# Patient Record
Sex: Male | Born: 1980 | Race: Black or African American | Hispanic: No | Marital: Married | Smoking: Former smoker
Health system: Southern US, Community
[De-identification: ages and names within clinical notes are randomized; demographics above are authoritative.]

## PROBLEM LIST (undated history)

## (undated) HISTORY — PX: OTHER SURGICAL HISTORY: SHX169

---

## 2015-09-01 ENCOUNTER — Ambulatory Visit (INDEPENDENT_AMBULATORY_CARE_PROVIDER_SITE_OTHER): Payer: 59 | Admitting: Unknown Physician Specialty

## 2015-09-01 ENCOUNTER — Encounter: Payer: Self-pay | Admitting: Unknown Physician Specialty

## 2015-09-01 VITALS — BP 126/72 | HR 56 | Temp 99.2°F | Ht 66.6 in | Wt 155.8 lb

## 2015-09-01 DIAGNOSIS — Z0000 Encounter for general adult medical examination without abnormal findings: Secondary | ICD-10-CM | POA: Diagnosis not present

## 2015-09-01 NOTE — Progress Notes (Signed)
   BP 126/72 mmHg  Pulse 56  Temp(Src) 99.2 F (37.3 C)  Ht 5' 6.6" (1.692 m)  Wt 155 lb 12.8 oz (70.67 kg)  BMI 24.69 kg/m2  SpO2 99%   Subjective:    Patient ID: Navy Belay, male    DOB: [birth year], 34 y.o.   MRN: [id number]  HPI: Janmichael Giraud is a 34 y.o. male  Chief Complaint  Patient presents with  . Establish Care    Relevant past medical, surgical, family and social history reviewed and updated as indicated. Interim medical history since our last visit reviewed. Allergies and medications reviewed and updated.  Review of Systems  Constitutional: Negative.   HENT: Negative.   Eyes: Negative.   Respiratory: Negative.   Cardiovascular: Negative.   Gastrointestinal: Negative.   Endocrine: Negative.   Genitourinary: Negative.   Skin: Negative.   Allergic/Immunologic: Negative.   Neurological: Negative.   Hematological: Negative.   Psychiatric/Behavioral: Negative.     Per HPI unless specifically indicated above     Objective:    BP 126/72 mmHg  Pulse 56  Temp(Src) 99.2 F (37.3 C)  Ht 5' 6.6" (1.692 m)  Wt 155 lb 12.8 oz (70.67 kg)  BMI 24.69 kg/m2  SpO2 99%  Wt Readings from Last 3 Encounters:  09/01/15 155 lb 12.8 oz (70.67 kg)    Physical Exam  Constitutional: He is oriented to person, place, and time. He appears well-developed and well-nourished.  HENT:  Head: Normocephalic.  Eyes: Pupils are equal, round, and reactive to light.  Cardiovascular: Normal rate, regular rhythm and normal heart sounds.   Pulmonary/Chest: Effort normal.  Abdominal: Soft. Bowel sounds are normal.  Musculoskeletal: Normal range of motion.  Neurological: He is alert and oriented to person, place, and time. He has normal reflexes.  Skin: Skin is warm and dry.  Psychiatric: He has a normal mood and affect. His behavior is normal. Judgment and thought content normal.     Assessment & Plan:   Problem List Items Addressed This Visit    None    Visit Diagnoses    Annual physical exam    -  Primary    Relevant Orders    Lipid Panel w/o Chol/HDL Ratio    Comprehensive metabolic panel        Follow up plan: Return in about 1 year (around 08/31/2016).

## 2015-09-02 ENCOUNTER — Encounter: Payer: Self-pay | Admitting: Unknown Physician Specialty

## 2015-09-02 LAB — COMPREHENSIVE METABOLIC PANEL
ALT: 18 IU/L (ref 0–44)
AST: 19 IU/L (ref 0–40)
Albumin/Globulin Ratio: 1.7 (ref 1.1–2.5)
Albumin: 4.3 g/dL (ref 3.5–5.5)
Alkaline Phosphatase: 50 IU/L (ref 39–117)
BUN/Creatinine Ratio: 10 (ref 8–19)
BUN: 11 mg/dL (ref 6–20)
Bilirubin Total: 0.3 mg/dL (ref 0.0–1.2)
CALCIUM: 9.4 mg/dL (ref 8.7–10.2)
CO2: 25 mmol/L (ref 18–29)
CREATININE: 1.06 mg/dL (ref 0.76–1.27)
Chloride: 102 mmol/L (ref 97–108)
GFR calc Af Amer: 105 mL/min/{1.73_m2} (ref >59)
GFR, EST NON AFRICAN AMERICAN: 91 mL/min/{1.73_m2} (ref >59)
GLUCOSE: 72 mg/dL (ref 65–99)
Globulin, Total: 2.6 g/dL (ref 1.5–4.5)
Potassium: 4.1 mmol/L (ref 3.5–5.2)
Sodium: 140 mmol/L (ref 134–144)
Total Protein: 6.9 g/dL (ref 6.0–8.5)

## 2015-09-02 LAB — LIPID PANEL W/O CHOL/HDL RATIO
Cholesterol, Total: 139 mg/dL (ref 100–199)
HDL: 46 mg/dL (ref >39)
LDL CALC: 82 mg/dL (ref 0–99)
Triglycerides: 55 mg/dL (ref 0–149)
VLDL CHOLESTEROL CAL: 11 mg/dL (ref 5–40)

## 2016-09-05 ENCOUNTER — Ambulatory Visit (INDEPENDENT_AMBULATORY_CARE_PROVIDER_SITE_OTHER): Payer: 59 | Admitting: Unknown Physician Specialty

## 2016-09-05 ENCOUNTER — Encounter: Payer: Self-pay | Admitting: Unknown Physician Specialty

## 2016-09-05 VITALS — BP 120/80 | HR 70 | Temp 98.8°F | Ht 66.6 in | Wt 159.4 lb

## 2016-09-05 DIAGNOSIS — Z23 Encounter for immunization: Secondary | ICD-10-CM | POA: Diagnosis not present

## 2016-09-05 DIAGNOSIS — Z0000 Encounter for general adult medical examination without abnormal findings: Secondary | ICD-10-CM | POA: Diagnosis not present

## 2016-09-05 NOTE — Patient Instructions (Addendum)

## 2016-09-05 NOTE — Progress Notes (Signed)
BP 120/80 (BP Location: Right Arm, Patient Position: Sitting, Cuff Size: Large)   Pulse 70   Temp 98.8 F (37.1 C)   Ht 5' 6.6" (1.692 m)   Wt 159 lb 6.4 oz (72.3 kg)   SpO2 99%   BMI 25.27 kg/m    Subjective:    Patient ID: Donald Little, male    DOB: [birth year], 35 y.o.   MRN: [id number]  HPI: Donald Little is a 35 y.o. male  Chief Complaint  Patient presents with  . Annual Exam   Social History   Social History  . Marital status: Married    Spouse name: N/A  . Number of children: N/A  . Years of education: N/A   Occupational History  . Not on file.   Social History Main Topics  . Smoking status: Never Smoker  . Smokeless tobacco: Never Used  . Alcohol use 0.0 oz/week     Comment: on occasion  . Drug use: No  . Sexual activity: Yes   Other Topics Concern  . Not on file   Social History Narrative  . No narrative on file   Family History  Problem Relation Age of Onset  . Cancer Father     prostate  . Kidney disease Maternal Grandmother   . Diabetes Maternal Grandmother   . Stroke Paternal Grandmother   . Hypertension Paternal Grandfather    History reviewed. No pertinent past medical history.  Past Surgical History:  Procedure Laterality Date  . lymphoma removed      Relevant past medical, surgical, family and social history reviewed and updated as indicated. Interim medical history since our last visit reviewed. Allergies and medications reviewed and updated.  Review of Systems  Constitutional: Negative.   HENT: Negative.   Eyes: Negative.   Respiratory: Negative.   Cardiovascular: Negative.   Gastrointestinal: Negative.   Endocrine: Negative.   Genitourinary: Negative.   Skin: Negative.   Allergic/Immunologic: Negative.   Neurological: Negative.   Hematological: Negative.   Psychiatric/Behavioral: Negative.     Per HPI unless specifically indicated above     Objective:    BP 120/80 (BP Location: Right Arm, Patient Position:  Sitting, Cuff Size: Large)   Pulse 70   Temp 98.8 F (37.1 C)   Ht 5' 6.6" (1.692 m)   Wt 159 lb 6.4 oz (72.3 kg)   SpO2 99%   BMI 25.27 kg/m   Wt Readings from Last 3 Encounters:  09/05/16 159 lb 6.4 oz (72.3 kg)  09/01/15 155 lb 12.8 oz (70.7 kg)    Physical Exam  Constitutional: He is oriented to person, place, and time. He appears well-developed and well-nourished.  HENT:  Head: Normocephalic.  Right Ear: Tympanic membrane, external ear and ear canal normal.  Left Ear: Tympanic membrane, external ear and ear canal normal.  Mouth/Throat: Uvula is midline, oropharynx is clear and moist and mucous membranes are normal.  Eyes: Pupils are equal, round, and reactive to light.  Cardiovascular: Normal rate, regular rhythm and normal heart sounds.  Exam reveals no gallop and no friction rub.   No murmur heard. Pulmonary/Chest: Effort normal and breath sounds normal. No respiratory distress.  Abdominal: Soft. Bowel sounds are normal. He exhibits no distension. There is no tenderness.  Musculoskeletal: Normal range of motion.  Neurological: He is alert and oriented to person, place, and time. He has normal reflexes.  Skin: Skin is warm and dry.  Psychiatric: He has a normal mood and affect. His behavior is normal.  Judgment and thought content normal.    Results for orders placed or performed in visit on 09/01/15  Lipid Panel w/o Chol/HDL Ratio  Result Value Ref Range   Cholesterol, Total 139 100 - 199 mg/dL   Triglycerides 55 0 - 149 mg/dL   HDL 46 >16 mg/dL   VLDL Cholesterol Cal 11 5 - 40 mg/dL   LDL Calculated 82 0 - 99 mg/dL  Comprehensive metabolic panel  Result Value Ref Range   Glucose 72 65 - 99 mg/dL   BUN 11 6 - 20 mg/dL   Creatinine, Ser 1.09 0.76 - 1.27 mg/dL   GFR calc non Af Amer 91 >59 mL/min/1.73   GFR calc Af Amer 105 >59 mL/min/1.73   BUN/Creatinine Ratio 10 8 - 19   Sodium 140 134 - 144 mmol/L   Potassium 4.1 3.5 - 5.2 mmol/L   Chloride 102 97 - 108 mmol/L    CO2 25 18 - 29 mmol/L   Calcium 9.4 8.7 - 10.2 mg/dL   Total Protein 6.9 6.0 - 8.5 g/dL   Albumin 4.3 3.5 - 5.5 g/dL   Globulin, Total 2.6 1.5 - 4.5 g/dL   Albumin/Globulin Ratio 1.7 1.1 - 2.5   Bilirubin Total 0.3 0.0 - 1.2 mg/dL   Alkaline Phosphatase 50 39 - 117 IU/L   AST 19 0 - 40 IU/L   ALT 18 0 - 44 IU/L      Assessment & Plan:   Problem List Items Addressed This Visit    None    Visit Diagnoses    Need for influenza vaccination    -  Primary   Relevant Orders   Flu Vaccine QUAD 36+ mos IM (Completed)   Annual physical exam       Relevant Orders   CBC with Differential/Platelet   Comprehensive metabolic panel   Lipid Panel w/o Chol/HDL Ratio   TSH       Follow up plan: Return in about 1 year (around 09/05/2017).

## 2016-09-06 ENCOUNTER — Encounter: Payer: Self-pay | Admitting: Unknown Physician Specialty

## 2016-09-06 LAB — LIPID PANEL W/O CHOL/HDL RATIO
Cholesterol, Total: 152 mg/dL (ref 100–199)
HDL: 49 mg/dL (ref >39)
LDL Calculated: 90 mg/dL (ref 0–99)
Triglycerides: 65 mg/dL (ref 0–149)
VLDL Cholesterol Cal: 13 mg/dL (ref 5–40)

## 2016-09-06 LAB — COMPREHENSIVE METABOLIC PANEL
A/G RATIO: 1.5 (ref 1.2–2.2)
ALBUMIN: 4.3 g/dL (ref 3.5–5.5)
ALT: 24 IU/L (ref 0–44)
AST: 21 IU/L (ref 0–40)
Alkaline Phosphatase: 53 IU/L (ref 39–117)
BILIRUBIN TOTAL: 0.3 mg/dL (ref 0.0–1.2)
BUN / CREAT RATIO: 11 (ref 9–20)
BUN: 11 mg/dL (ref 6–20)
CHLORIDE: 101 mmol/L (ref 96–106)
CO2: 25 mmol/L (ref 18–29)
Calcium: 9.4 mg/dL (ref 8.7–10.2)
Creatinine, Ser: 1.03 mg/dL (ref 0.76–1.27)
GFR calc non Af Amer: 94 mL/min/{1.73_m2} (ref >59)
GFR, EST AFRICAN AMERICAN: 108 mL/min/{1.73_m2} (ref >59)
GLOBULIN, TOTAL: 2.9 g/dL (ref 1.5–4.5)
Glucose: 101 mg/dL — ABNORMAL HIGH (ref 65–99)
POTASSIUM: 4.1 mmol/L (ref 3.5–5.2)
SODIUM: 140 mmol/L (ref 134–144)
TOTAL PROTEIN: 7.2 g/dL (ref 6.0–8.5)

## 2016-09-06 LAB — CBC WITH DIFFERENTIAL/PLATELET
Basophils Absolute: 0 10*3/uL (ref 0.0–0.2)
Basos: 0 %
EOS (ABSOLUTE): 0.2 10*3/uL (ref 0.0–0.4)
Eos: 3 %
Hematocrit: 38.1 % (ref 37.5–51.0)
Hemoglobin: 13.4 g/dL (ref 12.6–17.7)
IMMATURE GRANS (ABS): 0 10*3/uL (ref 0.0–0.1)
Immature Granulocytes: 0 %
LYMPHS: 31 %
Lymphocytes Absolute: 2.1 10*3/uL (ref 0.7–3.1)
MCH: 30.2 pg (ref 26.6–33.0)
MCHC: 35.2 g/dL (ref 31.5–35.7)
MCV: 86 fL (ref 79–97)
Monocytes Absolute: 0.5 10*3/uL (ref 0.1–0.9)
Monocytes: 8 %
NEUTROS ABS: 3.9 10*3/uL (ref 1.4–7.0)
Neutrophils: 58 %
PLATELETS: 165 10*3/uL (ref 150–379)
RBC: 4.44 x10E6/uL (ref 4.14–5.80)
RDW: 13.2 % (ref 12.3–15.4)
WBC: 6.9 10*3/uL (ref 3.4–10.8)

## 2016-09-06 LAB — TSH: TSH: 1.13 u[IU]/mL (ref 0.450–4.500)

## 2016-09-06 NOTE — Progress Notes (Signed)
Normal labs.  Patient notified by letter.

## 2016-09-12 ENCOUNTER — Encounter: Payer: Self-pay | Admitting: Unknown Physician Specialty

## 2017-08-31 ENCOUNTER — Ambulatory Visit (INDEPENDENT_AMBULATORY_CARE_PROVIDER_SITE_OTHER): Payer: 59 | Admitting: Family Medicine

## 2017-08-31 ENCOUNTER — Encounter: Payer: Self-pay | Admitting: Family Medicine

## 2017-08-31 VITALS — BP 126/81 | HR 67 | Temp 98.3°F | Ht 67.0 in | Wt 155.0 lb

## 2017-08-31 DIAGNOSIS — Z0000 Encounter for general adult medical examination without abnormal findings: Secondary | ICD-10-CM | POA: Diagnosis not present

## 2017-08-31 NOTE — Progress Notes (Signed)
BP 126/81   Pulse 67   Temp 98.3 F (36.8 C)   Ht  (1.702 m)   Wt 155 lb (70.3 kg)   SpO2 100%   BMI 24.28 kg/m    Subjective:    Patient ID: Donald Little, male    DOB: 11/14/1981, 36 y.o.   MRN: [id number]  HPI: Daltyn Degroat is a 36 y.o. male presenting on 08/31/2017 for comprehensive medical examination. Current medical complaints include:none  Has biometric form for work to be completed.  Declined tdap Not fasting for blood work.   He currently lives with: wife and daughter Interim Problems from his last visit: no  Depression Screen done today and results listed below:  Depression screen [facility] 2/9 08/31/2017 09/01/2015  Decreased Interest 0 0  Down, Depressed, Hopeless 0 0  PHQ - 2 Score 0 0    The patient does not have a history of falls. I did not complete a risk assessment for falls. A plan of care for falls was not documented.   Past Medical History:  History reviewed. No pertinent past medical history.  Surgical History:  Past Surgical History:  Procedure Laterality Date  . lymphoma removed      Medications:  No current outpatient prescriptions on file prior to visit.   No current facility-administered medications on file prior to visit.     Allergies:  No Known Allergies  Social History:  Social History   Social History  . Marital status: Married    Spouse name: N/A  . Number of children: N/A  . Years of education: N/A   Occupational History  . Not on file.   Social History Main Topics  . Smoking status: Never Smoker  . Smokeless tobacco: Never Used  . Alcohol use 0.0 oz/week     Comment: on occasion  . Drug use: No  . Sexual activity: Yes   Other Topics Concern  . Not on file   Social History Narrative  . No narrative on file   History  Smoking Status  . Never Smoker  Smokeless Tobacco  . Never Used   History  Alcohol Use  . 0.0 oz/week    Comment: on occasion    Family History:  Family History  Problem  Relation Age of Onset  . Cancer Father        prostate  . Kidney disease Maternal Grandmother   . Diabetes Maternal Grandmother   . Stroke Paternal Grandmother   . Hypertension Paternal Grandfather     Past medical history, surgical history, medications, allergies, family history and social history reviewed with patient today and changes made to appropriate areas of the chart.   Review of Systems - General ROS: negative Psychological ROS: negative Ophthalmic ROS: negative Respiratory ROS: no cough, shortness of breath, or wheezing Cardiovascular ROS: no chest pain or dyspnea on exertion Gastrointestinal ROS: no abdominal pain, change in bowel habits, or black or bloody stools Genito-Urinary ROS: no dysuria, trouble voiding, or hematuria Musculoskeletal ROS: negative Neurological ROS: no TIA or stroke symptoms Dermatological ROS: negative All other ROS negative except what is listed above and in the HPI.      Objective:    BP 126/81   Pulse 67   Temp 98.3 F (36.8 C)   Ht  (1.702 m)   Wt 155 lb (70.3 kg)   SpO2 100%   BMI 24.28 kg/m   Wt Readings from Last 3 Encounters:  08/31/17 155 lb (70.3 kg)  09/05/16  159 lb 6.4 oz (72.3 kg)  09/01/15 155 lb 12.8 oz (70.7 kg)    Physical Exam  Constitutional: He is oriented to person, place, and time. He appears well-developed and well-nourished. No distress.  HENT:  Head: Atraumatic.  Right Ear: External ear normal.  Left Ear: External ear normal.  Nose: Nose normal.  Mouth/Throat: Oropharynx is clear and moist.  Eyes: Pupils are equal, round, and reactive to light. Conjunctivae are normal. No scleral icterus.  Neck: Normal range of motion. Neck supple.  Cardiovascular: Normal rate, regular rhythm, normal heart sounds and intact distal pulses.   No murmur heard. Pulmonary/Chest: Effort normal and breath sounds normal. No respiratory distress.  Abdominal: Soft. Bowel sounds are normal. He exhibits no distension and no  mass. There is no tenderness. There is no guarding.  Musculoskeletal: Normal range of motion. He exhibits no edema or tenderness.  Neurological: He is alert and oriented to person, place, and time. He has normal reflexes.  Skin: Skin is warm and dry. No rash noted.  Psychiatric: He has a normal mood and affect. His behavior is normal.  Nursing note and vitals reviewed.   Results for orders placed or performed in visit on 08/31/17  CBC with Differential/Platelet  Result Value Ref Range   WBC 6.0 3.4 - 10.8 x10E3/uL   RBC 4.26 4.14 - 5.80 x10E6/uL   Hemoglobin 13.1 13.0 - 17.7 g/dL   Hematocrit 16.1 09.6 - 51.0 %   MCV 89 79 - 97 fL   MCH 30.8 26.6 - 33.0 pg   MCHC 34.7 31.5 - 35.7 g/dL   RDW 04.5 40.9 - 81.1 %   Platelets 167 150 - 379 x10E3/uL   Neutrophils 45 Not Estab. %   Lymphs 34 Not Estab. %   Monocytes 11 Not Estab. %   Eos 9 Not Estab. %   Basos 1 Not Estab. %   Neutrophils Absolute 2.7 1.4 - 7.0 x10E3/uL   Lymphocytes Absolute 2.1 0.7 - 3.1 x10E3/uL   Monocytes Absolute 0.7 0.1 - 0.9 x10E3/uL   EOS (ABSOLUTE) 0.6 (H) 0.0 - 0.4 x10E3/uL   Basophils Absolute 0.0 0.0 - 0.2 x10E3/uL   Immature Granulocytes 0 Not Estab. %   Immature Grans (Abs) 0.0 0.0 - 0.1 x10E3/uL  Comprehensive metabolic panel  Result Value Ref Range   Glucose 100 (H) 65 - 99 mg/dL   BUN 11 6 - 20 mg/dL   Creatinine, Ser 9.14 0.76 - 1.27 mg/dL   GFR calc non Af Amer 86 >59 mL/min/1.73   GFR calc Af Amer 99 >59 mL/min/1.73   BUN/Creatinine Ratio 10 9 - 20   Sodium 138 134 - 144 mmol/L   Potassium 3.8 3.5 - 5.2 mmol/L   Chloride 101 96 - 106 mmol/L   CO2 24 20 - 29 mmol/L   Calcium 9.2 8.7 - 10.2 mg/dL   Total Protein 7.1 6.0 - 8.5 g/dL   Albumin 4.4 3.5 - 5.5 g/dL   Globulin, Total 2.7 1.5 - 4.5 g/dL   Albumin/Globulin Ratio 1.6 1.2 - 2.2   Bilirubin Total 0.6 0.0 - 1.2 mg/dL   Alkaline Phosphatase 57 39 - 117 IU/L   AST 20 0 - 40 IU/L   ALT 20 0 - 44 IU/L  Lipid Panel w/o Chol/HDL Ratio    Result Value Ref Range   Cholesterol, Total 162 100 - 199 mg/dL   Triglycerides 59 0 - 149 mg/dL   HDL 54 >78 mg/dL   VLDL Cholesterol Cal 12  5 - 40 mg/dL   LDL Calculated 96 0 - 99 mg/dL      Assessment & Plan:   Problem List Items Addressed This Visit    None    Visit Diagnoses    Annual physical exam    -  Primary   Await non-fasting labs. Form completed pending results   Relevant Orders   CBC with Differential/Platelet (Completed)   Comprehensive metabolic panel (Completed)   Lipid Panel w/o Chol/HDL Ratio (Completed)       Discussed aspirin prophylaxis for myocardial infarction prevention and decision was it was not indicated  LABORATORY TESTING:  Health maintenance labs ordered today as discussed above.   The natural history of prostate cancer and ongoing controversy regarding screening and potential treatment outcomes of prostate cancer has been discussed with the patient. The meaning of a false positive PSA and a false negative PSA has been discussed. He indicates understanding of the limitations of this screening test and wishes not to proceed with screening PSA testing.   IMMUNIZATIONS:   - Tdap: Tetanus vaccination status reviewed: declined. - Influenza: Refused  PATIENT COUNSELING:    Sexuality: Discussed sexually transmitted diseases, partner selection, use of condoms, avoidance of unintended pregnancy  and contraceptive alternatives.   Advised to avoid cigarette smoking.  I discussed with the patient that most people either abstain from alcohol or drink within safe limits (<=14/week and <=4 drinks/occasion for males, <=7/weeks and <= 3 drinks/occasion for females) and that the risk for alcohol disorders and other health effects rises proportionally with the number of drinks per week and how often a drinker exceeds daily limits.  Discussed cessation/primary prevention of drug use and availability of treatment for abuse.   Diet: Encouraged to adjust caloric  intake to maintain  or achieve ideal body weight, to reduce intake of dietary saturated fat and total fat, to limit sodium intake by avoiding high sodium foods and not adding table salt, and to maintain adequate dietary potassium and calcium preferably from fresh fruits, vegetables, and low-fat dairy products.    stressed the importance of regular exercise  Injury prevention: Discussed safety belts, safety helmets, smoke detector, smoking near bedding or upholstery.   Dental health: Discussed importance of regular tooth brushing, flossing, and dental visits.   Follow up plan: NEXT PREVENTATIVE PHYSICAL DUE IN 1 YEAR. Return in about 1 year (around 08/31/2018) for CPE.

## 2017-09-01 LAB — COMPREHENSIVE METABOLIC PANEL
A/G RATIO: 1.6 (ref 1.2–2.2)
ALT: 20 IU/L (ref 0–44)
AST: 20 IU/L (ref 0–40)
Albumin: 4.4 g/dL (ref 3.5–5.5)
Alkaline Phosphatase: 57 IU/L (ref 39–117)
BILIRUBIN TOTAL: 0.6 mg/dL (ref 0.0–1.2)
BUN/Creatinine Ratio: 10 (ref 9–20)
BUN: 11 mg/dL (ref 6–20)
CHLORIDE: 101 mmol/L (ref 96–106)
CO2: 24 mmol/L (ref 20–29)
Calcium: 9.2 mg/dL (ref 8.7–10.2)
Creatinine, Ser: 1.1 mg/dL (ref 0.76–1.27)
GFR calc non Af Amer: 86 mL/min/{1.73_m2} (ref >59)
GFR, EST AFRICAN AMERICAN: 99 mL/min/{1.73_m2} (ref >59)
GLUCOSE: 100 mg/dL — AB (ref 65–99)
Globulin, Total: 2.7 g/dL (ref 1.5–4.5)
POTASSIUM: 3.8 mmol/L (ref 3.5–5.2)
Sodium: 138 mmol/L (ref 134–144)
Total Protein: 7.1 g/dL (ref 6.0–8.5)

## 2017-09-01 LAB — CBC WITH DIFFERENTIAL/PLATELET
BASOS ABS: 0 10*3/uL (ref 0.0–0.2)
Basos: 1 %
EOS (ABSOLUTE): 0.6 10*3/uL — ABNORMAL HIGH (ref 0.0–0.4)
EOS: 9 %
HEMOGLOBIN: 13.1 g/dL (ref 13.0–17.7)
Hematocrit: 37.8 % (ref 37.5–51.0)
IMMATURE GRANULOCYTES: 0 %
Immature Grans (Abs): 0 10*3/uL (ref 0.0–0.1)
Lymphocytes Absolute: 2.1 10*3/uL (ref 0.7–3.1)
Lymphs: 34 %
MCH: 30.8 pg (ref 26.6–33.0)
MCHC: 34.7 g/dL (ref 31.5–35.7)
MCV: 89 fL (ref 79–97)
MONOCYTES: 11 %
MONOS ABS: 0.7 10*3/uL (ref 0.1–0.9)
NEUTROS PCT: 45 %
Neutrophils Absolute: 2.7 10*3/uL (ref 1.4–7.0)
Platelets: 167 10*3/uL (ref 150–379)
RBC: 4.26 x10E6/uL (ref 4.14–5.80)
RDW: 13.3 % (ref 12.3–15.4)
WBC: 6 10*3/uL (ref 3.4–10.8)

## 2017-09-01 LAB — LIPID PANEL W/O CHOL/HDL RATIO
Cholesterol, Total: 162 mg/dL (ref 100–199)
HDL: 54 mg/dL (ref >39)
LDL Calculated: 96 mg/dL (ref 0–99)
TRIGLYCERIDES: 59 mg/dL (ref 0–149)
VLDL CHOLESTEROL CAL: 12 mg/dL (ref 5–40)

## 2017-09-02 NOTE — Patient Instructions (Signed)
Follow up in 1 year.

## 2017-09-03 ENCOUNTER — Telehealth: Payer: Self-pay | Admitting: Family Medicine

## 2017-09-03 NOTE — Telephone Encounter (Signed)
LVM for patient to return phone call.  

## 2017-09-03 NOTE — Telephone Encounter (Signed)
Please call and let him know all of his labs came back normal. I've filled in his biometric form, but looks like before sending we need to ask his phone and email he would like to put down. Let him know we are sending that form in please.

## 2017-09-04 NOTE — Telephone Encounter (Signed)
Spoke with patient.  Phone number is [phone] and email address is [email or web address].  He also scheduled a tetanus shot for Friday.    Thank You

## 2017-09-04 NOTE — Telephone Encounter (Signed)
Form and labs faxed. 

## 2017-09-04 NOTE — Telephone Encounter (Signed)
Form completed and given to Amy. 

## 2017-09-07 ENCOUNTER — Ambulatory Visit (INDEPENDENT_AMBULATORY_CARE_PROVIDER_SITE_OTHER): Payer: 59

## 2017-09-07 DIAGNOSIS — Z23 Encounter for immunization: Secondary | ICD-10-CM | POA: Diagnosis not present

## 2017-09-12 ENCOUNTER — Encounter: Payer: 59 | Admitting: Unknown Physician Specialty

## 2018-07-12 ENCOUNTER — Encounter: Payer: Self-pay | Admitting: Unknown Physician Specialty

## 2018-09-02 ENCOUNTER — Encounter: Payer: 59 | Admitting: Unknown Physician Specialty

## 2018-09-03 ENCOUNTER — Encounter: Payer: Self-pay | Admitting: Physician Assistant

## 2019-12-01 ENCOUNTER — Emergency Department
Admission: EM | Admit: 2019-12-01 | Discharge: 2019-12-01 | Disposition: A | Discharge status: Home / Self Care | Payer: 59 | Attending: Emergency Medicine | Admitting: Emergency Medicine

## 2019-12-01 ENCOUNTER — Emergency Department: Payer: 59

## 2019-12-01 ENCOUNTER — Other Ambulatory Visit: Payer: Self-pay

## 2019-12-01 DIAGNOSIS — Y999 Unspecified external cause status: Secondary | ICD-10-CM | POA: Diagnosis not present

## 2019-12-01 DIAGNOSIS — Y92410 Unspecified street and highway as the place of occurrence of the external cause: Secondary | ICD-10-CM | POA: Insufficient documentation

## 2019-12-01 DIAGNOSIS — M7918 Myalgia, other site: Secondary | ICD-10-CM

## 2019-12-01 DIAGNOSIS — M545 Low back pain: Secondary | ICD-10-CM | POA: Diagnosis present

## 2019-12-01 DIAGNOSIS — M542 Cervicalgia: Secondary | ICD-10-CM | POA: Diagnosis not present

## 2019-12-01 DIAGNOSIS — Y9389 Activity, other specified: Secondary | ICD-10-CM | POA: Insufficient documentation

## 2019-12-01 MED ORDER — IBUPROFEN 600 MG PO TABS: 600.0000 mg | ORAL_TABLET | Freq: Three times a day (TID) | ORAL | 0 refills | Status: DC | PRN

## 2019-12-01 MED ORDER — CYCLOBENZAPRINE HCL 10 MG PO TABS: 10.0000 mg | ORAL_TABLET | Freq: Three times a day (TID) | ORAL | 0 refills | Status: DC | PRN

## 2019-12-01 MED ORDER — TRAMADOL HCL 50 MG PO TABS: 50.0000 mg | ORAL_TABLET | Freq: Four times a day (QID) | ORAL | 0 refills | Status: AC | PRN
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
## 2019-12-01 NOTE — Discharge Instructions (Signed)
Your x-ray was negative for any acute findings.  All discharge care instruction take medication as directed.
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
## 2021-09-01 NOTE — Progress Notes (Deleted)
There were no vitals taken for this visit.   Subjective:    Patient ID: Donald Little, male    DOB: [birth year], 40 y.o.   MRN: [id number]  HPI: Donald Little is a 40 y.o. male  No chief complaint on file.  Patient presents to clinic to establish care with new PCP.  Patient reports a history of ***. Patient denies a history of: Hypertension, Elevated Cholesterol, Diabetes, Thyroid problems, Depression, Anxiety, Neurological problems, and Abdominal problems.   Active Ambulatory Problems    Diagnosis Date Noted   No Active Ambulatory Problems   Resolved Ambulatory Problems    Diagnosis Date Noted   No Resolved Ambulatory Problems   No Additional Past Medical History   Past Surgical History:  Procedure Laterality Date   lymphoma removed     Family History  Problem Relation Age of Onset   Cancer Father        prostate   Kidney disease Maternal Grandmother    Diabetes Maternal Grandmother    Stroke Paternal Grandmother    Hypertension Paternal Grandfather       Review of Systems  Per HPI unless specifically indicated above     Objective:    There were no vitals taken for this visit.  Wt Readings from Last 3 Encounters:  12/01/19 150 lb (68 kg)  08/31/17 155 lb (70.3 kg)  09/05/16 159 lb 6.4 oz (72.3 kg)    Physical Exam  Results for orders placed or performed in visit on 08/31/17  CBC with Differential/Platelet  Result Value Ref Range   WBC 6.0 3.4 - 10.8 x10E3/uL   RBC 4.26 4.14 - 5.80 x10E6/uL   Hemoglobin 13.1 13.0 - 17.7 g/dL   Hematocrit 54.6 27.0 - 51.0 %   MCV 89 79 - 97 fL   MCH 30.8 26.6 - 33.0 pg   MCHC 34.7 31.5 - 35.7 g/dL   RDW 35.0 09.3 - 81.8 %   Platelets 167 150 - 379 x10E3/uL   Neutrophils 45 Not Estab. %   Lymphs 34 Not Estab. %   Monocytes 11 Not Estab. %   Eos 9 Not Estab. %   Basos 1 Not Estab. %   Neutrophils Absolute 2.7 1.4 - 7.0 x10E3/uL   Lymphocytes Absolute 2.1 0.7 - 3.1 x10E3/uL   Monocytes Absolute 0.7 0.1 - 0.9  x10E3/uL   EOS (ABSOLUTE) 0.6 (H) 0.0 - 0.4 x10E3/uL   Basophils Absolute 0.0 0.0 - 0.2 x10E3/uL   Immature Granulocytes 0 Not Estab. %   Immature Grans (Abs) 0.0 0.0 - 0.1 x10E3/uL  Comprehensive metabolic panel  Result Value Ref Range   Glucose 100 (H) 65 - 99 mg/dL   BUN 11 6 - 20 mg/dL   Creatinine, Ser 2.99 0.76 - 1.27 mg/dL   GFR calc non Af Amer 86 >59 mL/min/1.73   GFR calc Af Amer 99 >59 mL/min/1.73   BUN/Creatinine Ratio 10 9 - 20   Sodium 138 134 - 144 mmol/L   Potassium 3.8 3.5 - 5.2 mmol/L   Chloride 101 96 - 106 mmol/L   CO2 24 20 - 29 mmol/L   Calcium 9.2 8.7 - 10.2 mg/dL   Total Protein 7.1 6.0 - 8.5 g/dL   Albumin 4.4 3.5 - 5.5 g/dL   Globulin, Total 2.7 1.5 - 4.5 g/dL   Albumin/Globulin Ratio 1.6 1.2 - 2.2   Bilirubin Total 0.6 0.0 - 1.2 mg/dL   Alkaline Phosphatase 57 39 - 117 IU/L   AST 20 0 -  40 IU/L   ALT 20 0 - 44 IU/L  Lipid Panel w/o Chol/HDL Ratio  Result Value Ref Range   Cholesterol, Total 162 100 - 199 mg/dL   Triglycerides 59 0 - 149 mg/dL   HDL 54 >29 mg/dL   VLDL Cholesterol Cal 12 5 - 40 mg/dL   LDL Calculated 96 0 - 99 mg/dL      Assessment & Plan:   Problem List Items Addressed This Visit   None Visit Diagnoses     Encounter to establish care    -  Primary        Follow up plan: No follow-ups on file.

## 2021-09-02 ENCOUNTER — Ambulatory Visit (INDEPENDENT_AMBULATORY_CARE_PROVIDER_SITE_OTHER): Payer: 59 | Admitting: Nurse Practitioner

## 2021-09-02 ENCOUNTER — Encounter: Payer: Self-pay | Admitting: Nurse Practitioner

## 2021-09-02 ENCOUNTER — Other Ambulatory Visit: Payer: Self-pay

## 2021-09-02 VITALS — BP 127/88 | HR 57 | Temp 98.7°F | Ht 66.0 in | Wt 170.2 lb

## 2021-09-02 DIAGNOSIS — Z7689 Persons encountering health services in other specified circumstances: Secondary | ICD-10-CM

## 2021-09-02 DIAGNOSIS — Z1159 Encounter for screening for other viral diseases: Secondary | ICD-10-CM

## 2021-09-02 DIAGNOSIS — Z0000 Encounter for general adult medical examination without abnormal findings: Secondary | ICD-10-CM

## 2021-09-02 LAB — URINALYSIS, ROUTINE W REFLEX MICROSCOPIC
Bilirubin, UA: NEGATIVE
Glucose, UA: NEGATIVE
Ketones, UA: NEGATIVE
Leukocytes,UA: NEGATIVE
Nitrite, UA: NEGATIVE
Protein,UA: NEGATIVE
RBC, UA: NEGATIVE
Specific Gravity, UA: 1.025 (ref 1.005–1.030)
Urobilinogen, Ur: 0.2 mg/dL (ref 0.2–1.0)
pH, UA: 6 (ref 5.0–7.5)

## 2021-09-02 NOTE — Progress Notes (Signed)
BP 127/88   Pulse (!) 57   Temp 98.7 F (37.1 C) (Oral)   Ht 5\' 6"  (1.676 m)   Wt 170 lb 3.2 oz (77.2 kg)   SpO2 97%   BMI 27.47 kg/m    Subjective:    Patient ID: Donald Little, male    DOB: May 10, 1981, 39 y.o.   MRN: [id number]  HPI: Donald Little is a 40 y.o. male presenting on 09/02/2021 for comprehensive medical examination. Current medical complaints include:none  He currently lives with: Interim Problems from his last visit: no  Patient presents to clinic to establish care with new PCP.  Patient denies any significant past medical history.  Patient denies a history of: Hypertension, Elevated Cholesterol, Diabetes, Thyroid problems, Depression, Anxiety, Neurological problems, and Abdominal problems.    Denies HA, CP, SOB, dizziness, palpitations, visual changes, and lower extremity swelling.  Active Ambulatory Problems    Diagnosis Date Noted   No Active Ambulatory Problems   Resolved Ambulatory Problems    Diagnosis Date Noted   No Resolved Ambulatory Problems   No Additional Past Medical History   Past Surgical History:  Procedure Laterality Date   lymphoma removed     Family History  Problem Relation Age of Onset   Cancer Father        prostate   Kidney disease Maternal Grandmother    Diabetes Maternal Grandmother    Stroke Paternal Grandmother    Hypertension Paternal Grandfather     Depression Screen done today and results listed below:  Depression screen [facility] 2/9 09/02/2021 08/31/2017 09/01/2015  Decreased Interest 0 0 0  Down, Depressed, Hopeless 0 0 0  PHQ - 2 Score 0 0 0    The patient does not have a history of falls. I did complete a risk assessment for falls. A plan of care for falls was documented.   Past Medical History:  History reviewed. No pertinent past medical history.  Surgical History:  Past Surgical History:  Procedure Laterality Date   lymphoma removed      Medications:  No current outpatient medications on file prior  to visit.   No current facility-administered medications on file prior to visit.    Allergies:  No Known Allergies  Social History:  Social History   Socioeconomic History   Marital status: Married    Spouse name: Not on file   Number of children: Not on file   Years of education: Not on file   Highest education level: Not on file  Occupational History   Not on file  Tobacco Use   Smoking status: Never   Smokeless tobacco: Never  Vaping Use   Vaping Use: Never used  Substance and Sexual Activity   Alcohol use: Yes    Alcohol/week: 0.0 standard drinks    Comment: on occasion   Drug use: No   Sexual activity: Yes  Other Topics Concern   Not on file  Social History Narrative   Not on file   Social Determinants of Health   Financial Resource Strain: Not on file  Food Insecurity: Not on file  Transportation Needs: Not on file  Physical Activity: Not on file  Stress: Not on file  Social Connections: Not on file  Intimate Partner Violence: Not on file   Social History   Tobacco Use  Smoking Status Never  Smokeless Tobacco Never   Social History   Substance and Sexual Activity  Alcohol Use Yes   Alcohol/week: 0.0 standard drinks   Comment:  on occasion    Family History:  Family History  Problem Relation Age of Onset   Cancer Father        prostate   Kidney disease Maternal Grandmother    Diabetes Maternal Grandmother    Stroke Paternal Grandmother    Hypertension Paternal Grandfather     Past medical history, surgical history, medications, allergies, family history and social history reviewed with patient today and changes made to appropriate areas of the chart.   Review of Systems  Eyes:  Negative for blurred vision and double vision.  Respiratory:  Negative for shortness of breath.   Cardiovascular:  Negative for chest pain, palpitations and leg swelling.  Neurological:  Negative for dizziness and headaches.  All other ROS negative except what is  listed above and in the HPI.      Objective:    BP 127/88   Pulse (!) 57   Temp 98.7 F (37.1 C) (Oral)   Ht 5\' 6"  (1.676 m)   Wt 170 lb 3.2 oz (77.2 kg)   SpO2 97%   BMI 27.47 kg/m   Wt Readings from Last 3 Encounters:  09/02/21 170 lb 3.2 oz (77.2 kg)  12/01/19 150 lb (68 kg)  08/31/17 155 lb (70.3 kg)    Physical Exam Vitals and nursing note reviewed.  Constitutional:      General: He is not in acute distress.    Appearance: Normal appearance. He is not ill-appearing, toxic-appearing or diaphoretic.  HENT:     Head: Normocephalic.     Right Ear: Tympanic membrane, ear canal and external ear normal.     Left Ear: Tympanic membrane, ear canal and external ear normal.     Nose: Nose normal. No congestion or rhinorrhea.     Mouth/Throat:     Mouth: Mucous membranes are moist.  Eyes:     General:        Right eye: No discharge.        Left eye: No discharge.     Extraocular Movements: Extraocular movements intact.     Conjunctiva/sclera: Conjunctivae normal.     Pupils: Pupils are equal, round, and reactive to light.  Cardiovascular:     Rate and Rhythm: Normal rate and regular rhythm.     Heart sounds: No murmur heard. Pulmonary:     Effort: Pulmonary effort is normal. No respiratory distress.     Breath sounds: Normal breath sounds. No wheezing, rhonchi or rales.  Abdominal:     General: Abdomen is flat. Bowel sounds are normal. There is no distension.     Palpations: Abdomen is soft.     Tenderness: There is no abdominal tenderness. There is no guarding.  Musculoskeletal:     Cervical back: Normal range of motion and neck supple.  Skin:    General: Skin is warm and dry.     Capillary Refill: Capillary refill takes less than 2 seconds.  Neurological:     General: No focal deficit present.     Mental Status: He is alert and oriented to person, place, and time.     Cranial Nerves: No cranial nerve deficit.     Motor: No weakness.     Deep Tendon Reflexes:  Reflexes normal.  Psychiatric:        Mood and Affect: Mood normal.        Behavior: Behavior normal.        Thought Content: Thought content normal.        Judgment: Judgment  normal.    Results for orders placed or performed in visit on 08/31/17  CBC with Differential/Platelet  Result Value Ref Range   WBC 6.0 3.4 - 10.8 x10E3/uL   RBC 4.26 4.14 - 5.80 x10E6/uL   Hemoglobin 13.1 13.0 - 17.7 g/dL   Hematocrit 46.9 62.9 - 51.0 %   MCV 89 79 - 97 fL   MCH 30.8 26.6 - 33.0 pg   MCHC 34.7 31.5 - 35.7 g/dL   RDW 52.8 41.3 - 24.4 %   Platelets 167 150 - 379 x10E3/uL   Neutrophils 45 Not Estab. %   Lymphs 34 Not Estab. %   Monocytes 11 Not Estab. %   Eos 9 Not Estab. %   Basos 1 Not Estab. %   Neutrophils Absolute 2.7 1.4 - 7.0 x10E3/uL   Lymphocytes Absolute 2.1 0.7 - 3.1 x10E3/uL   Monocytes Absolute 0.7 0.1 - 0.9 x10E3/uL   EOS (ABSOLUTE) 0.6 (H) 0.0 - 0.4 x10E3/uL   Basophils Absolute 0.0 0.0 - 0.2 x10E3/uL   Immature Granulocytes 0 Not Estab. %   Immature Grans (Abs) 0.0 0.0 - 0.1 x10E3/uL  Comprehensive metabolic panel  Result Value Ref Range   Glucose 100 (H) 65 - 99 mg/dL   BUN 11 6 - 20 mg/dL   Creatinine, Ser 0.10 0.76 - 1.27 mg/dL   GFR calc non Af Amer 86 >59 mL/min/1.73   GFR calc Af Amer 99 >59 mL/min/1.73   BUN/Creatinine Ratio 10 9 - 20   Sodium 138 134 - 144 mmol/L   Potassium 3.8 3.5 - 5.2 mmol/L   Chloride 101 96 - 106 mmol/L   CO2 24 20 - 29 mmol/L   Calcium 9.2 8.7 - 10.2 mg/dL   Total Protein 7.1 6.0 - 8.5 g/dL   Albumin 4.4 3.5 - 5.5 g/dL   Globulin, Total 2.7 1.5 - 4.5 g/dL   Albumin/Globulin Ratio 1.6 1.2 - 2.2   Bilirubin Total 0.6 0.0 - 1.2 mg/dL   Alkaline Phosphatase 57 39 - 117 IU/L   AST 20 0 - 40 IU/L   ALT 20 0 - 44 IU/L  Lipid Panel w/o Chol/HDL Ratio  Result Value Ref Range   Cholesterol, Total 162 100 - 199 mg/dL   Triglycerides 59 0 - 149 mg/dL   HDL 54 >27 mg/dL   VLDL Cholesterol Cal 12 5 - 40 mg/dL   LDL Calculated 96 0 - 99  mg/dL      Assessment & Plan:   Problem List Items Addressed This Visit   None Visit Diagnoses     Annual physical exam    -  Primary   Encounter to establish care       Encounter for hepatitis C screening test for low risk patient            Discussed aspirin prophylaxis for myocardial infarction prevention and decision was it was not indicated  LABORATORY TESTING:  Health maintenance labs ordered today as discussed above.   The natural history of prostate cancer and ongoing controversy regarding screening and potential treatment outcomes of prostate cancer has been discussed with the patient. The meaning of a false positive PSA and a false negative PSA has been discussed. He indicates understanding of the limitations of this screening test and wishes not to proceed with screening PSA testing.   IMMUNIZATIONS:   - Tdap: Tetanus vaccination status reviewed: last tetanus booster within 10 years. - Influenza: Refused - Pneumovax: Not applicable - Prevnar: Not applicable -  HPV: Not applicable - Zostavax vaccine: Not applicable  SCREENING: - Colonoscopy: Not applicable  Discussed with patient purpose of the colonoscopy is to detect colon cancer at curable precancerous or early stages   - AAA Screening: Not applicable  -Hearing Test: Not applicable  -Spirometry: Not applicable   PATIENT COUNSELING:    Sexuality: Discussed sexually transmitted diseases, partner selection, use of condoms, avoidance of unintended pregnancy  and contraceptive alternatives.   Advised to avoid cigarette smoking.  I discussed with the patient that most people either abstain from alcohol or drink within safe limits (<=14/week and <=4 drinks/occasion for males, <=7/weeks and <= 3 drinks/occasion for females) and that the risk for alcohol disorders and other health effects rises proportionally with the number of drinks per week and how often a drinker exceeds daily limits.  Discussed  cessation/primary prevention of drug use and availability of treatment for abuse.   Diet: Encouraged to adjust caloric intake to maintain  or achieve ideal body weight, to reduce intake of dietary saturated fat and total fat, to limit sodium intake by avoiding high sodium foods and not adding table salt, and to maintain adequate dietary potassium and calcium preferably from fresh fruits, vegetables, and low-fat dairy products.    stressed the importance of regular exercise  Injury prevention: Discussed safety belts, safety helmets, smoke detector, smoking near bedding or upholstery.   Dental health: Discussed importance of regular tooth brushing, flossing, and dental visits.   Follow up plan: NEXT PREVENTATIVE PHYSICAL DUE IN 1 YEAR. No follow-ups on file.

## 2021-09-02 NOTE — Progress Notes (Signed)
Hi Donald Little. Your urine from today looks good.  I will send you another message once the rest of your lab work comes back.

## 2021-09-03 LAB — CBC WITH DIFFERENTIAL/PLATELET
Basophils Absolute: 0 10*3/uL (ref 0.0–0.2)
Basos: 1 %
EOS (ABSOLUTE): 0.3 10*3/uL (ref 0.0–0.4)
Eos: 5 %
Hematocrit: 38.4 % (ref 37.5–51.0)
Hemoglobin: 13.4 g/dL (ref 13.0–17.7)
Immature Grans (Abs): 0 10*3/uL (ref 0.0–0.1)
Immature Granulocytes: 0 %
Lymphocytes Absolute: 1.8 10*3/uL (ref 0.7–3.1)
Lymphs: 29 %
MCH: 30.6 pg (ref 26.6–33.0)
MCHC: 34.9 g/dL (ref 31.5–35.7)
MCV: 88 fL (ref 79–97)
Monocytes Absolute: 0.4 10*3/uL (ref 0.1–0.9)
Monocytes: 7 %
Neutrophils Absolute: 3.6 10*3/uL (ref 1.4–7.0)
Neutrophils: 58 %
Platelets: 204 10*3/uL (ref 150–450)
RBC: 4.38 x10E6/uL (ref 4.14–5.80)
RDW: 12.5 % (ref 11.6–15.4)
WBC: 6.2 10*3/uL (ref 3.4–10.8)

## 2021-09-03 LAB — LIPID PANEL
Chol/HDL Ratio: 3.4 ratio (ref 0.0–5.0)
Cholesterol, Total: 172 mg/dL (ref 100–199)
HDL: 50 mg/dL (ref >39)
LDL Chol Calc (NIH): 112 mg/dL — ABNORMAL HIGH (ref 0–99)
Triglycerides: 50 mg/dL (ref 0–149)
VLDL Cholesterol Cal: 10 mg/dL (ref 5–40)

## 2021-09-03 LAB — COMPREHENSIVE METABOLIC PANEL
ALT: 20 IU/L (ref 0–44)
AST: 21 IU/L (ref 0–40)
Albumin/Globulin Ratio: 1.7 (ref 1.2–2.2)
Albumin: 4.5 g/dL (ref 4.0–5.0)
Alkaline Phosphatase: 56 IU/L (ref 44–121)
BUN/Creatinine Ratio: 11 (ref 9–20)
BUN: 13 mg/dL (ref 6–24)
Bilirubin Total: 0.2 mg/dL (ref 0.0–1.2)
CO2: 22 mmol/L (ref 20–29)
Calcium: 9.4 mg/dL (ref 8.7–10.2)
Chloride: 102 mmol/L (ref 96–106)
Creatinine, Ser: 1.2 mg/dL (ref 0.76–1.27)
Globulin, Total: 2.7 g/dL (ref 1.5–4.5)
Glucose: 88 mg/dL (ref 65–99)
Potassium: 4.5 mmol/L (ref 3.5–5.2)
Sodium: 139 mmol/L (ref 134–144)
Total Protein: 7.2 g/dL (ref 6.0–8.5)
eGFR: 78 mL/min/{1.73_m2} (ref >59)

## 2021-09-03 LAB — TSH: TSH: 1.12 u[IU]/mL (ref 0.450–4.500)

## 2021-09-03 LAB — HEPATITIS C ANTIBODY: Hep C Virus Ab: <0.1 s/co ratio (ref 0.0–0.9)

## 2021-09-04 NOTE — Progress Notes (Signed)
HI [employer]. It was nice to meet you last week. Your lab work looks good.  No concerns at this time. Continue with your current medication regimen.  Follow up as discussed.  Please let me know if you have any questions.

## 2022-03-21 ENCOUNTER — Encounter: Payer: Self-pay | Admitting: Ophthalmology

## 2022-03-27 NOTE — Discharge Instructions (Signed)

## 2022-03-29 ENCOUNTER — Other Ambulatory Visit: Payer: Self-pay

## 2022-03-29 ENCOUNTER — Encounter: Payer: Self-pay | Admitting: Ophthalmology

## 2022-03-29 ENCOUNTER — Encounter
Admission: RE | Disposition: A | Discharge status: Home / Self Care | Payer: Self-pay | Source: Home / Self Care | Attending: Ophthalmology

## 2022-03-29 ENCOUNTER — Ambulatory Visit: Payer: 59 | Admitting: Anesthesiology

## 2022-03-29 ENCOUNTER — Ambulatory Visit
Admission: RE | Admit: 2022-03-29 | Discharge: 2022-03-29 | Disposition: A | Discharge status: Home / Self Care | Payer: 59 | Attending: Ophthalmology | Admitting: Ophthalmology

## 2022-03-29 DIAGNOSIS — H2511 Age-related nuclear cataract, right eye: Secondary | ICD-10-CM | POA: Diagnosis present

## 2022-03-29 DIAGNOSIS — F17210 Nicotine dependence, cigarettes, uncomplicated: Secondary | ICD-10-CM | POA: Insufficient documentation

## 2022-03-29 HISTORY — PX: CATARACT EXTRACTION W/PHACO: SHX586

## 2022-03-29 SURGERY — PHACOEMULSIFICATION, CATARACT, WITH IOL INSERTION
Anesthesia: Monitor Anesthesia Care | Site: Eye | Laterality: Right

## 2022-03-29 MED ORDER — MOXIFLOXACIN HCL 0.5 % OP SOLN
OPHTHALMIC | Status: DC | PRN
  Administered 2022-03-29: 0.2 mL via OPHTHALMIC

## 2022-03-29 MED ORDER — LACTATED RINGERS IV SOLN: INTRAVENOUS | Status: DC

## 2022-03-29 MED ORDER — MIDAZOLAM HCL 2 MG/2ML IJ SOLN
INTRAMUSCULAR | Status: DC | PRN
Start: 2022-03-29 — End: 2022-03-29
  Administered 2022-03-29: 2 mg via INTRAVENOUS

## 2022-03-29 MED ORDER — BRIMONIDINE TARTRATE-TIMOLOL 0.2-0.5 % OP SOLN
OPHTHALMIC | Status: DC | PRN
  Administered 2022-03-29: 1 [drp] via OPHTHALMIC

## 2022-03-29 MED ORDER — FENTANYL CITRATE (PF) 100 MCG/2ML IJ SOLN
INTRAMUSCULAR | Status: DC | PRN
  Administered 2022-03-29 (×2): 50 ug via INTRAVENOUS
  Administered 2022-03-29: 100 ug via INTRAVENOUS

## 2022-03-29 MED ORDER — ACETAMINOPHEN 325 MG PO TABS: 325.0000 mg | ORAL_TABLET | Freq: Once | ORAL | Status: DC

## 2022-03-29 MED ORDER — SIGHTPATH DOSE#1 NA HYALUR & NA CHOND-NA HYALUR IO KIT
PACK | INTRAOCULAR | Status: DC | PRN
  Administered 2022-03-29: 1 via OPHTHALMIC

## 2022-03-29 MED ORDER — SIGHTPATH DOSE#1 BSS IO SOLN
INTRAOCULAR | Status: DC | PRN
  Administered 2022-03-29: 40 mL via OPHTHALMIC

## 2022-03-29 MED ORDER — TETRACAINE HCL 0.5 % OP SOLN
1.0000 [drp] | OPHTHALMIC | Status: DC | PRN
  Administered 2022-03-29 (×3): 1 [drp] via OPHTHALMIC

## 2022-03-29 MED ORDER — SIGHTPATH DOSE#1 BSS IO SOLN
INTRAOCULAR | Status: DC | PRN
  Administered 2022-03-29: 15 mL

## 2022-03-29 MED ORDER — ACETAMINOPHEN 160 MG/5ML PO SOLN: 325.0000 mg | Freq: Once | ORAL | Status: DC

## 2022-03-29 MED ORDER — LIDOCAINE HCL (PF) 2 % IJ SOLN
INTRAOCULAR | Status: DC | PRN
  Administered 2022-03-29: 1 mL via INTRAOCULAR

## 2022-03-29 MED ORDER — ARMC OPHTHALMIC DILATING DROPS
1.0000 "application " | OPHTHALMIC | Status: DC | PRN
  Administered 2022-03-29 (×3): 1 via OPHTHALMIC

## 2022-03-29 SURGICAL SUPPLY — 21 items
CANNULA ANT/CHMB 27G (MISCELLANEOUS) IMPLANT
CANNULA ANT/CHMB 27GA (MISCELLANEOUS) IMPLANT
CATARACT SUITE SIGHTPATH (MISCELLANEOUS) ×2 IMPLANT
DISSECTOR HYDRO NUCLEUS 50X22 (MISCELLANEOUS) ×2 IMPLANT
DRSG TEGADERM 2-3/8X2-3/4 SM (GAUZE/BANDAGES/DRESSINGS) ×2 IMPLANT
FEE CATARACT SUITE SIGHTPATH (MISCELLANEOUS) ×1 IMPLANT
GLOVE SURG GAMMEX PI TX LF 7.5 (GLOVE) ×2 IMPLANT
GLOVE SURG SYN 8.5  E (GLOVE) ×1
GLOVE SURG SYN 8.5 E (GLOVE) ×1 IMPLANT
GLOVE SURG SYN 8.5 PF PI (GLOVE) ×1 IMPLANT
LENS IOL TECNIS EYHANCE 15.5 (Intraocular Lens) ×1 IMPLANT
NDL FILTER BLUNT 18X1 1/2 (NEEDLE) ×1 IMPLANT
NEEDLE FILTER BLUNT 18X 1/2SAF (NEEDLE) ×1
NEEDLE FILTER BLUNT 18X1 1/2 (NEEDLE) ×1 IMPLANT
PACK VIT ANT 23G (MISCELLANEOUS) IMPLANT
RING MALYGIN (MISCELLANEOUS) IMPLANT
SUT ETHILON 10-0 CS-B-6CS-B-6 (SUTURE)
SUTURE EHLN 10-0 CS-B-6CS-B-6 (SUTURE) IMPLANT
SYR 3ML LL SCALE MARK (SYRINGE) ×2 IMPLANT
SYR 5ML LL (SYRINGE) ×2 IMPLANT
WATER STERILE IRR 250ML POUR (IV SOLUTION) ×2 IMPLANT

## 2022-03-29 NOTE — Anesthesia Procedure Notes (Signed)
Procedure Name: [place] ?Date/Time: 03/29/2022 2:14 PM ?Performed by: Mayme Genta, CRNA ?Pre-anesthesia Checklist: Patient identified, Emergency Drugs available, Suction available, Timeout performed and Patient being monitored ?Patient Re-evaluated:Patient Re-evaluated prior to induction ?Oxygen Delivery Method: Nasal cannula ?Placement Confirmation: positive ETCO2 ? ? ? ? ?

## 2022-03-29 NOTE — Anesthesia Preprocedure Evaluation (Signed)
Anesthesia Evaluation  ?Patient identified by MRN, date of birth, ID band ?Patient awake ? ? ? ?Reviewed: ?Allergy & Precautions, H&P , NPO status , Patient's Chart, lab work & pertinent test results ? ?Airway ?Mallampati: II ? ?TM Distance: >3 FB ?Neck ROM: full ? ? ? Dental ?no notable dental hx. ? ?  ?Pulmonary ?Current Smoker and Patient abstained from smoking.,  ?  ?Pulmonary exam normal ?breath sounds clear to auscultation ? ? ? ? ? ? Cardiovascular ?Normal cardiovascular exam ?Rhythm:regular Rate:Normal ? ? ?  ?Neuro/Psych ?  ? GI/Hepatic ?  ?Endo/Other  ? ? Renal/GU ?  ? ?  ?Musculoskeletal ? ? Abdominal ?  ?Peds ? Hematology ?  ?Anesthesia Other Findings ? ? Reproductive/Obstetrics ? ?  ? ? ? ? ? ? ? ? ? ? ? ? ? ?  ?  ? ? ? ? ? ? ? ? ?Anesthesia Physical ?Anesthesia Plan ? ?ASA: 2 ? ?Anesthesia Plan: MAC  ? ?Post-op Pain Management: Minimal or no pain anticipated  ? ?Induction:  ? ?PONV Risk Score and Plan: 0 and Treatment may vary due to age or medical condition ? ?Airway Management Planned:  ? ?Additional Equipment:  ? ?Intra-op Plan:  ? ?Post-operative Plan:  ? ?Informed Consent: I have reviewed the patients History and Physical, chart, labs and discussed the procedure including the risks, benefits and alternatives for the proposed anesthesia with the patient or authorized representative who has indicated his/her understanding and acceptance.  ? ? ? ?Dental Advisory Given ? ?Plan Discussed with: CRNA ? ?Anesthesia Plan Comments:   ? ? ? ? ? ? ?Anesthesia Quick Evaluation ? ?

## 2022-03-29 NOTE — Anesthesia Postprocedure Evaluation (Signed)
Anesthesia Post Note ? ?Patient: Donald Little ? ?Procedure(s) Performed: CATARACT EXTRACTION PHACO AND INTRAOCULAR LENS PLACEMENT ([facility]) RIGHT (Right: Eye) ? ? ?  ?Patient location during evaluation: PACU ?Anesthesia Type: MAC ?Level of consciousness: awake and alert and oriented ?Pain management: satisfactory to patient ?Vital Signs Assessment: post-procedure vital signs reviewed and stable ?Respiratory status: spontaneous breathing, nonlabored ventilation and respiratory function stable ?Cardiovascular status: blood pressure returned to baseline and stable ?Postop Assessment: Adequate PO intake and No signs of nausea or vomiting ?Anesthetic complications: no ? ? ?No notable events documented. ? ?Raliegh Ip ? ? ? ? ? ?

## 2022-03-29 NOTE — H&P (Signed)
[place]  ? ?Primary Care Physician:  Jon Billings, NP ?Ophthalmologist: Dr. Merleen Nicely ? ?Pre-Procedure History & Physical: ?HPI:  Donald Little is a 41 y.o. male here for ophthalmic surgery. ?  ?History reviewed. No pertinent past medical history. ? ?Past Surgical History:  ?Procedure Laterality Date  ? lymphoma removed    ? ? ?Prior to Admission medications   ?Not on File  ? ? ?Allergies as of 03/14/2022  ? (No Known Allergies)  ? ? ?Family History  ?Problem Relation Age of Onset  ? Cancer Father   ?     prostate  ? Kidney disease Maternal Grandmother   ? Diabetes Maternal Grandmother   ? Stroke Paternal Grandmother   ? Hypertension Paternal Grandfather   ? ? ?Social History  ? ?Socioeconomic History  ? Marital status: Married  ?  Spouse name: Not on file  ? Number of children: Not on file  ? Years of education: Not on file  ? Highest education level: Not on file  ?Occupational History  ? Not on file  ?Tobacco Use  ? Smoking status: Some Days  ?  Types: Cigarettes, Cigars  ? Smokeless tobacco: Never  ?Vaping Use  ? Vaping Use: Never used  ?Substance and Sexual Activity  ? Alcohol use: Yes  ?  Alcohol/week: 0.0 standard drinks  ?  Comment: on occasion  ? Drug use: No  ? Sexual activity: Yes  ?Other Topics Concern  ? Not on file  ?Social History Narrative  ? Not on file  ? ?Social Determinants of Health  ? ?Financial Resource Strain: Not on file  ?Food Insecurity: Not on file  ?Transportation Needs: Not on file  ?Physical Activity: Not on file  ?Stress: Not on file  ?Social Connections: Not on file  ?Intimate Partner Violence: Not on file  ? ? ?Review of Systems: ?See HPI, otherwise negative ROS ? ?Physical Exam: ?BP 136/81   Pulse 61   Temp 97.8 ?F (36.6 ?C) (Temporal)   Resp 18   Ht 5\' 7"  (1.702 m)   Wt 80.1 kg   SpO2 99%   BMI 27.66 kg/m?  ?General:   Alert,  pleasant and cooperative in NAD ?Head:  Normocephalic and atraumatic. ?Lungs:  Clear to auscultation.    ?Heart:  Regular  rate and rhythm.  ? ?Impression/Plan: ?Donald Little is here for ophthalmic surgery. ? ?Risks, benefits, limitations, and alternatives regarding ophthalmic surgery have been reviewed with the patient.  Questions have been answered.  All parties agreeable. ? ? Leandrew Koyanagi, MD  03/29/2022, 1:57 PM ? ? ?

## 2022-03-29 NOTE — Transfer of Care (Signed)
Immediate Anesthesia Transfer of Care Note ? ?Patient: Donald Little ? ?Procedure(s) Performed: CATARACT EXTRACTION PHACO AND INTRAOCULAR LENS PLACEMENT ([facility]) RIGHT (Right: Eye) ? ?Patient Location: PACU ? ?Anesthesia Type: MAC ? ?Level of Consciousness: awake, alert  and patient cooperative ? ?Airway and Oxygen Therapy: Patient Spontanous Breathing and Patient connected to supplemental oxygen ? ?Post-op Assessment: Post-op Vital signs reviewed, Patient's Cardiovascular Status Stable, Respiratory Function Stable, Patent Airway and No signs of Nausea or vomiting ? ?Post-op Vital Signs: Reviewed and stable ? ?Complications: No notable events documented. ? ?

## 2022-03-29 NOTE — Op Note (Signed)
LOCATION:  [facility] ?  ?PREOPERATIVE DIAGNOSIS:    Nuclear sclerotic cataract right eye. H25.11 ?  ?POSTOPERATIVE DIAGNOSIS:  Nuclear sclerotic cataract right eye.   ?  ?PROCEDURE:  Phacoemusification with posterior chamber intraocular lens placement of the right eye  ? ?ULTRASOUND TIME: Procedure(s) with comments: ?CATARACT EXTRACTION PHACO AND INTRAOCULAR LENS PLACEMENT ([facility]) RIGHT (Right) - 0.12 ?0:03.4 ? ?LENS:   ?Implant Name Type Inv. Item Serial No. Manufacturer Lot No. LRB No. Used Action  ?LENS IOL TECNIS EYHANCE 15.5 - [id number] Intraocular Lens LENS IOL TECNIS EYHANCE 15.5 [id number] [employer]  Right 1 Implanted  ?   ?  ?  ?SURGEON:  Deberah Pelton, MD ?Assistant Surgeon: Deirdre Evener, MD ?  ?ANESTHESIA:  Topical with tetracaine drops and 2% Xylocaine jelly, augmented with 1% preservative-free intracameral lidocaine. ? ?  ?COMPLICATIONS:  None. ?  ?DESCRIPTION OF PROCEDURE:  The patient was identified in the holding room and transported to the operating room and placed in the supine position under the operating microscope.  The right eye was identified as the operative eye and it was prepped and draped in the usual sterile ophthalmic fashion. ?  ?A 1 millimeter clear-corneal paracentesis was made at the superotemporal position.  0.5 ml of preservative-free 1% lidocaine was injected into the anterior chamber. ?The anterior chamber was filled with Viscoat viscoelastic.  A 2.4 millimeter keratome was used to make a near-clear corneal incision at the inferotemporal position.  A curvilinear capsulorrhexis was made with a cystotome and capsulorrhexis forceps.  Balanced salt solution was used to hydrodissect and hydrodelineate the nucleus. ?  ?Phacoemulsification was then used in supracapsular fashion to remove the lens nucleus and epinucleus.  The remaining cortex was then removed using the irrigation and aspiration handpiece. Provisc was then placed into the capsular bag to distend it  for lens placement.  A +15.50 D DIB00 lens was then injected into the capsular bag.  The remaining viscoelastic was aspirated. ?  ?Wounds were hydrated with balanced salt solution.  The anterior chamber was inflated to a physiologic pressure with balanced salt solution.  No wound leaks were noted. Vigamox was injected intracamerally.  Timolol and Brimonidine drops were applied to the eye.  The patient was taken to the recovery room in stable condition without complications of anesthesia or surgery. ? ? ?Donald Little ?03/29/2022, 2:42 PM ? ?

## 2022-03-30 ENCOUNTER — Encounter: Payer: Self-pay | Admitting: Ophthalmology

## 2022-08-31 NOTE — Progress Notes (Signed)
BP 134/85   Pulse (!) 57   Temp 98.6 F (37 C) (Oral)   Ht 5' 8.5" (1.74 m)   Wt 176 lb (79.8 kg)   SpO2 98%   BMI 26.37 kg/m    Subjective:    Patient ID: Donald Little, male    DOB: Aug 26, 1981, 41 y.o.   MRN: [id number]  HPI: Donald Little is a 41 y.o. male presenting on 09/04/2022 for comprehensive medical examination. Current medical complaints include:none  He currently lives with: Interim Problems from his last visit: no   Denies HA, CP, SOB, dizziness, palpitations, visual changes, and lower extremity swelling.  Active Ambulatory Problems    Diagnosis Date Noted   No Active Ambulatory Problems   Resolved Ambulatory Problems    Diagnosis Date Noted   No Resolved Ambulatory Problems   No Additional Past Medical History   Past Surgical History:  Procedure Laterality Date   CATARACT EXTRACTION W/PHACO Right 03/29/2022   Procedure: CATARACT EXTRACTION PHACO AND INTRAOCULAR LENS PLACEMENT ([place]) RIGHT;  Surgeon: Leandrew Koyanagi, MD;  Location: [facility];  Service: Ophthalmology;  Laterality: Right;  0.12 0:03.4   lymphoma removed     Family History  Problem Relation Age of Onset   Cancer Father        prostate   Kidney disease Maternal Grandmother    Diabetes Maternal Grandmother    Stroke Paternal Grandmother    Hypertension Paternal Grandfather     Depression Screen done today and results listed below:     09/04/2022    9:12 AM 09/02/2021    9:43 AM 08/31/2017    1:01 PM 09/01/2015    9:15 AM  Depression screen PHQ 2/9  Decreased Interest 0 0 0 0  Down, Depressed, Hopeless 0 0 0 0  PHQ - 2 Score 0 0 0 0  Altered sleeping 0     Tired, decreased energy 0     Change in appetite 0     Feeling bad or failure about yourself  0     Trouble concentrating 0     Moving slowly or fidgety/restless 0     Suicidal thoughts 0     PHQ-9 Score 0     Difficult doing work/chores Not difficult at all       The patient does not have a history  of falls. I did complete a risk assessment for falls. A plan of care for falls was documented.   Past Medical History:  History reviewed. No pertinent past medical history.  Surgical History:  Past Surgical History:  Procedure Laterality Date   CATARACT EXTRACTION W/PHACO Right 03/29/2022   Procedure: CATARACT EXTRACTION PHACO AND INTRAOCULAR LENS PLACEMENT ([place]) RIGHT;  Surgeon: Leandrew Koyanagi, MD;  Location: [facility];  Service: Ophthalmology;  Laterality: Right;  0.12 0:03.4   lymphoma removed      Medications:  No current outpatient medications on file prior to visit.   No current facility-administered medications on file prior to visit.    Allergies:  No Known Allergies  Social History:  Social History   Socioeconomic History   Marital status: Married    Spouse name: Not on file   Number of children: Not on file   Years of education: Not on file   Highest education level: Not on file  Occupational History   Not on file  Tobacco Use   Smoking status: Former    Types: Cigars, Cigarettes   Smokeless tobacco: Never  Vaping  Use   Vaping Use: Never used  Substance and Sexual Activity   Alcohol use: Yes    Alcohol/week: 0.0 standard drinks of alcohol    Comment: on occasion   Drug use: No   Sexual activity: Yes  Other Topics Concern   Not on file  Social History Narrative   Not on file   Social Determinants of Health   Financial Resource Strain: Not on file  Food Insecurity: Not on file  Transportation Needs: Not on file  Physical Activity: Not on file  Stress: Not on file  Social Connections: Not on file  Intimate Partner Violence: Not on file   Social History   Tobacco Use  Smoking Status Former   Types: Cigars, Cigarettes  Smokeless Tobacco Never   Social History   Substance and Sexual Activity  Alcohol Use Yes   Alcohol/week: 0.0 standard drinks of alcohol   Comment: on occasion    Family History:  Family History  Problem  Relation Age of Onset   Cancer Father        prostate   Kidney disease Maternal Grandmother    Diabetes Maternal Grandmother    Stroke Paternal Grandmother    Hypertension Paternal Grandfather     Past medical history, surgical history, medications, allergies, family history and social history reviewed with patient today and changes made to appropriate areas of the chart.   Review of Systems  Eyes:  Negative for blurred vision and double vision.  Respiratory:  Negative for shortness of breath.   Cardiovascular:  Negative for chest pain, palpitations and leg swelling.  Neurological:  Negative for dizziness and headaches.   All other ROS negative except what is listed above and in the HPI.      Objective:    BP 134/85   Pulse (!) 57   Temp 98.6 F (37 C) (Oral)   Ht 5' 8.5" (1.74 m)   Wt 176 lb (79.8 kg)   SpO2 98%   BMI 26.37 kg/m   Wt Readings from Last 3 Encounters:  09/04/22 176 lb (79.8 kg)  03/29/22 176 lb 9.6 oz (80.1 kg)  09/02/21 170 lb 3.2 oz (77.2 kg)    Physical Exam Vitals and nursing note reviewed.  Constitutional:      General: He is not in acute distress.    Appearance: Normal appearance. He is normal weight. He is not ill-appearing, toxic-appearing or diaphoretic.  HENT:     Head: Normocephalic.     Right Ear: Tympanic membrane, ear canal and external ear normal.     Left Ear: Tympanic membrane, ear canal and external ear normal.     Nose: Nose normal. No congestion or rhinorrhea.     Mouth/Throat:     Mouth: Mucous membranes are moist.  Eyes:     General:        Right eye: No discharge.        Left eye: No discharge.     Extraocular Movements: Extraocular movements intact.     Conjunctiva/sclera: Conjunctivae normal.     Pupils: Pupils are equal, round, and reactive to light.  Cardiovascular:     Rate and Rhythm: Normal rate and regular rhythm.     Heart sounds: No murmur heard. Pulmonary:     Effort: Pulmonary effort is normal. No  respiratory distress.     Breath sounds: Normal breath sounds. No wheezing, rhonchi or rales.  Abdominal:     General: Abdomen is flat. Bowel sounds are normal. There is  no distension.     Palpations: Abdomen is soft.     Tenderness: There is no abdominal tenderness. There is no guarding.  Musculoskeletal:     Cervical back: Normal range of motion and neck supple.  Skin:    General: Skin is warm and dry.     Capillary Refill: Capillary refill takes less than 2 seconds.  Neurological:     General: No focal deficit present.     Mental Status: He is alert and oriented to person, place, and time.     Cranial Nerves: No cranial nerve deficit.     Motor: No weakness.     Deep Tendon Reflexes: Reflexes normal.  Psychiatric:        Mood and Affect: Mood normal.        Behavior: Behavior normal.        Thought Content: Thought content normal.        Judgment: Judgment normal.     Results for orders placed or performed in visit on 09/02/21  TSH  Result Value Ref Range   TSH 1.120 0.450 - 4.500 uIU/mL  Lipid panel  Result Value Ref Range   Cholesterol, Total 172 100 - 199 mg/dL   Triglycerides 50 0 - 149 mg/dL   HDL 50 >39 mg/dL   VLDL Cholesterol Cal 10 5 - 40 mg/dL   LDL Chol Calc (NIH) 112 (H) 0 - 99 mg/dL   Chol/HDL Ratio 3.4 0.0 - 5.0 ratio  CBC with Differential/Platelet  Result Value Ref Range   WBC 6.2 3.4 - 10.8 x10E3/uL   RBC 4.38 4.14 - 5.80 x10E6/uL   Hemoglobin 13.4 13.0 - 17.7 g/dL   Hematocrit 38.4 37.5 - 51.0 %   MCV 88 79 - 97 fL   MCH 30.6 26.6 - 33.0 pg   MCHC 34.9 31.5 - 35.7 g/dL   RDW 12.5 11.6 - 15.4 %   Platelets 204 150 - 450 x10E3/uL   Neutrophils 58 Not Estab. %   Lymphs 29 Not Estab. %   Monocytes 7 Not Estab. %   Eos 5 Not Estab. %   Basos 1 Not Estab. %   Neutrophils Absolute 3.6 1.4 - 7.0 x10E3/uL   Lymphocytes Absolute 1.8 0.7 - 3.1 x10E3/uL   Monocytes Absolute 0.4 0.1 - 0.9 x10E3/uL   EOS (ABSOLUTE) 0.3 0.0 - 0.4 x10E3/uL   Basophils  Absolute 0.0 0.0 - 0.2 x10E3/uL   Immature Granulocytes 0 Not Estab. %   Immature Grans (Abs) 0.0 0.0 - 0.1 x10E3/uL  Comprehensive metabolic panel  Result Value Ref Range   Glucose 88 65 - 99 mg/dL   BUN 13 6 - 24 mg/dL   Creatinine, Ser 1.20 0.76 - 1.27 mg/dL   eGFR 78 >59 mL/min/1.73   BUN/Creatinine Ratio 11 9 - 20   Sodium 139 134 - 144 mmol/L   Potassium 4.5 3.5 - 5.2 mmol/L   Chloride 102 96 - 106 mmol/L   CO2 22 20 - 29 mmol/L   Calcium 9.4 8.7 - 10.2 mg/dL   Total Protein 7.2 6.0 - 8.5 g/dL   Albumin 4.5 4.0 - 5.0 g/dL   Globulin, Total 2.7 1.5 - 4.5 g/dL   Albumin/Globulin Ratio 1.7 1.2 - 2.2   Bilirubin Total 0.2 0.0 - 1.2 mg/dL   Alkaline Phosphatase 56 44 - 121 IU/L   AST 21 0 - 40 IU/L   ALT 20 0 - 44 IU/L  Urinalysis, Routine w reflex microscopic  Result Value Ref Range  Specific Gravity, UA 1.025 1.005 - 1.030   pH, UA 6.0 5.0 - 7.5   Color, UA Yellow Yellow   Appearance Ur Clear Clear   Leukocytes,UA Negative Negative   Protein,UA Negative Negative/Trace   Glucose, UA Negative Negative   Ketones, UA Negative Negative   RBC, UA Negative Negative   Bilirubin, UA Negative Negative   Urobilinogen, Ur 0.2 0.2 - 1.0 mg/dL   Nitrite, UA Negative Negative  Hepatitis C Antibody  Result Value Ref Range   Hep C Virus Ab <0.1 0.0 - 0.9 s/co ratio      Assessment & Plan:   Problem List Items Addressed This Visit   None Visit Diagnoses     Annual physical exam    -  Primary   Health maintenance reviewed during visit today. Labs ordered. Declined flu shot.  TDAP up to date.    Relevant Orders   TSH   Lipid panel   CBC with Differential/Platelet   Comprehensive metabolic panel   Urinalysis, Routine w reflex microscopic   Screening for ischemic heart disease       Relevant Orders   Lipid panel        Discussed aspirin prophylaxis for myocardial infarction prevention and decision was it was not indicated  LABORATORY TESTING:  Health maintenance labs  ordered today as discussed above.   The natural history of prostate cancer and ongoing controversy regarding screening and potential treatment outcomes of prostate cancer has been discussed with the patient. The meaning of a false positive PSA and a false negative PSA has been discussed. He indicates understanding of the limitations of this screening test and wishes not to proceed with screening PSA testing.   IMMUNIZATIONS:   - Tdap: Tetanus vaccination status reviewed: last tetanus booster within 10 years. - Influenza: Refused - Pneumovax: Not applicable - Prevnar: Not applicable - HPV: Not applicable - Zostavax vaccine: Not applicable  SCREENING: - Colonoscopy: Not applicable  Discussed with patient purpose of the colonoscopy is to detect colon cancer at curable precancerous or early stages   - AAA Screening: Not applicable  -Hearing Test: Not applicable  -Spirometry: Not applicable   PATIENT COUNSELING:    Sexuality: Discussed sexually transmitted diseases, partner selection, use of condoms, avoidance of unintended pregnancy  and contraceptive alternatives.   Advised to avoid cigarette smoking.  I discussed with the patient that most people either abstain from alcohol or drink within safe limits (<=14/week and <=4 drinks/occasion for males, <=7/weeks and <= 3 drinks/occasion for females) and that the risk for alcohol disorders and other health effects rises proportionally with the number of drinks per week and how often a drinker exceeds daily limits.  Discussed cessation/primary prevention of drug use and availability of treatment for abuse.   Diet: Encouraged to adjust caloric intake to maintain  or achieve ideal body weight, to reduce intake of dietary saturated fat and total fat, to limit sodium intake by avoiding high sodium foods and not adding table salt, and to maintain adequate dietary potassium and calcium preferably from fresh fruits, vegetables, and low-fat dairy  products.    stressed the importance of regular exercise  Injury prevention: Discussed safety belts, safety helmets, smoke detector, smoking near bedding or upholstery.   Dental health: Discussed importance of regular tooth brushing, flossing, and dental visits.   Follow up plan: NEXT PREVENTATIVE PHYSICAL DUE IN 1 YEAR. Return in about 1 year (around 09/05/2023) for Physical and Fasting labs.

## 2022-09-04 ENCOUNTER — Encounter: Payer: Self-pay | Admitting: Nurse Practitioner

## 2022-09-04 ENCOUNTER — Ambulatory Visit (INDEPENDENT_AMBULATORY_CARE_PROVIDER_SITE_OTHER): Payer: 59 | Admitting: Nurse Practitioner

## 2022-09-04 VITALS — BP 134/85 | HR 57 | Temp 98.6°F | Ht 68.5 in | Wt 176.0 lb

## 2022-09-04 DIAGNOSIS — Z0000 Encounter for general adult medical examination without abnormal findings: Secondary | ICD-10-CM

## 2022-09-04 DIAGNOSIS — Z136 Encounter for screening for cardiovascular disorders: Secondary | ICD-10-CM | POA: Diagnosis not present

## 2022-09-04 LAB — URINALYSIS, ROUTINE W REFLEX MICROSCOPIC
Bilirubin, UA: NEGATIVE
Glucose, UA: NEGATIVE
Ketones, UA: NEGATIVE
Leukocytes,UA: NEGATIVE
Nitrite, UA: NEGATIVE
Protein,UA: NEGATIVE
RBC, UA: NEGATIVE
Specific Gravity, UA: 1.025 (ref 1.005–1.030)
Urobilinogen, Ur: 1 mg/dL (ref 0.2–1.0)
pH, UA: 6.5 (ref 5.0–7.5)

## 2022-09-05 LAB — COMPREHENSIVE METABOLIC PANEL
ALT: 17 IU/L (ref 0–44)
AST: 19 IU/L (ref 0–40)
Albumin/Globulin Ratio: 1.8 (ref 1.2–2.2)
Albumin: 4.2 g/dL (ref 4.1–5.1)
Alkaline Phosphatase: 52 IU/L (ref 44–121)
BUN/Creatinine Ratio: 12 (ref 9–20)
BUN: 13 mg/dL (ref 6–24)
Bilirubin Total: 0.4 mg/dL (ref 0.0–1.2)
CO2: 23 mmol/L (ref 20–29)
Calcium: 9.5 mg/dL (ref 8.7–10.2)
Chloride: 103 mmol/L (ref 96–106)
Creatinine, Ser: 1.11 mg/dL (ref 0.76–1.27)
Globulin, Total: 2.4 g/dL (ref 1.5–4.5)
Glucose: 78 mg/dL (ref 70–99)
Potassium: 4.3 mmol/L (ref 3.5–5.2)
Sodium: 138 mmol/L (ref 134–144)
Total Protein: 6.6 g/dL (ref 6.0–8.5)
eGFR: 86 mL/min/{1.73_m2} (ref >59)

## 2022-09-05 LAB — CBC WITH DIFFERENTIAL/PLATELET
Basophils Absolute: 0 10*3/uL (ref 0.0–0.2)
Basos: 1 %
EOS (ABSOLUTE): 0.3 10*3/uL (ref 0.0–0.4)
Eos: 5 %
Hematocrit: 40.9 % (ref 37.5–51.0)
Hemoglobin: 13.1 g/dL (ref 13.0–17.7)
Immature Grans (Abs): 0 10*3/uL (ref 0.0–0.1)
Immature Granulocytes: 0 %
Lymphocytes Absolute: 1.8 10*3/uL (ref 0.7–3.1)
Lymphs: 30 %
MCH: 28.7 pg (ref 26.6–33.0)
MCHC: 32 g/dL (ref 31.5–35.7)
MCV: 90 fL (ref 79–97)
Monocytes Absolute: 0.5 10*3/uL (ref 0.1–0.9)
Monocytes: 9 %
Neutrophils Absolute: 3.3 10*3/uL (ref 1.4–7.0)
Neutrophils: 55 %
Platelets: 196 10*3/uL (ref 150–450)
RBC: 4.57 x10E6/uL (ref 4.14–5.80)
RDW: 12.9 % (ref 11.6–15.4)
WBC: 5.9 10*3/uL (ref 3.4–10.8)

## 2022-09-05 LAB — LIPID PANEL
Chol/HDL Ratio: 3.2 ratio (ref 0.0–5.0)
Cholesterol, Total: 159 mg/dL (ref 100–199)
HDL: 50 mg/dL (ref >39)
LDL Chol Calc (NIH): 101 mg/dL — ABNORMAL HIGH (ref 0–99)
Triglycerides: 38 mg/dL (ref 0–149)
VLDL Cholesterol Cal: 8 mg/dL (ref 5–40)

## 2022-09-05 LAB — TSH: TSH: 0.939 u[IU]/mL (ref 0.450–4.500)

## 2022-09-05 NOTE — Progress Notes (Signed)
HI [employer]. It was nice to see you yesterday.  Your lab work looks good.  Cholesterol improved from prior.  Continue with low fat diet and exercise.  No concerns at this time. Continue with your current medication regimen.  Follow up as discussed.  Please let me know if you have any questions.

## 2023-08-20 ENCOUNTER — Encounter: Payer: 59 | Admitting: Nurse Practitioner

## 2023-09-10 ENCOUNTER — Encounter: Payer: 59 | Admitting: Nurse Practitioner

## 2024-10-14 ENCOUNTER — Ambulatory Visit: Payer: Self-pay

## 2024-10-14 NOTE — Telephone Encounter (Signed)
 FYI Only or Action Required?: Action required by provider: request for appointment.  Patient was last seen in primary care on 09/04/2022 by Melvin Pao, NP.  Called Nurse Triage reporting Hypertension.  Symptoms began yesterday.  Interventions attempted: Nothing.  Symptoms are: unchanged.  Triage Disposition: See PCP When Office is Open (Within 3 Days)  Patient/caregiver understands and will follow disposition?: Unsure     Copied from CRM #[id number]. Topic: Clinical - Red Word Triage >> Oct 14, 2024  8:12 AM Donna BRAVO wrote: Red Word that prompted transfer to Nurse Triage: patient stating High BP   BP Symptoms: -When leaving work yesterday he was walking at an angle, little dizzy, walking to car -Went to bed at 5pm -Woke up at 9pm felt like the room was spinning -Last night 168/110 taken by wife -This morning 142/90 taken around 6am -This moring feeling 80% better but not up to par Reason for Disposition  Systolic BP >= 160 OR Diastolic >= 100  Answer Assessment - Initial Assessment Questions Patient states he currently does not feel  100% himself. Denies feeling dizzy, chest pain or shortness of breath currently. States his head does not feel normal and states symptoms are moderate. This RN offered pt an appointment in office and he declined and states he wants to return to work. He states he will go to [facility] today for symptoms.  Patient also requesting to schedule a physical on Nov 25th. This RN made patient aware that the date was not available. Will route to office for follow up  1. BLOOD PRESSURE: What is your blood pressure? Did you take at least two measurements 5 minutes apart?     Last night 168/110, this morning 142/90  2. ONSET: When did you take your blood pressure?     Last night  3. HOW: How did you take your blood pressure? (e.g., automatic home BP monitor, visiting nurse)     Automatic BP monitor  4. HISTORY: Do you have a history of high blood  pressure?     No 5. MEDICINES: Are you taking any medicines for blood pressure? Have you missed any doses recently?     Not currently on BP meds  6. OTHER SYMPTOMS: Do you have any symptoms? (e.g., blurred vision, chest pain, difficulty breathing, headache, weakness)     Yesterday felt dizzy when walking to his car and felt the room was spinning last night around 9pm  Protocols used: Blood Pressure - High-A-AH
